# Patient Record
Sex: Female | Born: 1960 | Race: White | Hispanic: No | Smoking: Former smoker
Health system: Southern US, Community
[De-identification: ages and names within clinical notes are randomized; demographics above are authoritative.]

## PROBLEM LIST (undated history)

## (undated) DIAGNOSIS — M1990 Unspecified osteoarthritis, unspecified site: Secondary | ICD-10-CM

## (undated) DIAGNOSIS — K52839 Microscopic colitis, unspecified: Secondary | ICD-10-CM

## (undated) DIAGNOSIS — K219 Gastro-esophageal reflux disease without esophagitis: Secondary | ICD-10-CM

## (undated) DIAGNOSIS — E890 Postprocedural hypothyroidism: Secondary | ICD-10-CM

## (undated) DIAGNOSIS — M109 Gout, unspecified: Secondary | ICD-10-CM

## (undated) DIAGNOSIS — K279 Peptic ulcer, site unspecified, unspecified as acute or chronic, without hemorrhage or perforation: Secondary | ICD-10-CM

## (undated) DIAGNOSIS — J45909 Unspecified asthma, uncomplicated: Secondary | ICD-10-CM

## (undated) HISTORY — PX: THYROIDECTOMY: SHX17

## (undated) HISTORY — PX: VESICO-VAGINAL FISTULA REPAIR: SHX5129

## (undated) HISTORY — PX: KNEE ARTHROSCOPY: SUR90

## (undated) HISTORY — PX: ABDOMINAL HYSTERECTOMY: SHX81

## (undated) HISTORY — PX: BLADDER REPAIR: SHX76

---

## 2016-07-24 ENCOUNTER — Emergency Department (HOSPITAL_COMMUNITY)
Admission: EM | Admit: 2016-07-24 | Discharge: 2016-07-24 | Disposition: A | Discharge status: Home / Self Care | Payer: Self-pay | Attending: Emergency Medicine | Admitting: Emergency Medicine

## 2016-07-24 ENCOUNTER — Encounter (HOSPITAL_COMMUNITY): Payer: Self-pay | Admitting: Emergency Medicine

## 2016-07-24 ENCOUNTER — Emergency Department (HOSPITAL_COMMUNITY): Payer: Self-pay

## 2016-07-24 DIAGNOSIS — Z79899 Other long term (current) drug therapy: Secondary | ICD-10-CM | POA: Insufficient documentation

## 2016-07-24 DIAGNOSIS — M545 Low back pain, unspecified: Secondary | ICD-10-CM

## 2016-07-24 DIAGNOSIS — F17210 Nicotine dependence, cigarettes, uncomplicated: Secondary | ICD-10-CM | POA: Insufficient documentation

## 2016-07-24 HISTORY — DX: Peptic ulcer, site unspecified, unspecified as acute or chronic, without hemorrhage or perforation: K27.9

## 2016-07-24 HISTORY — DX: Microscopic colitis, unspecified: K52.839

## 2016-07-24 HISTORY — DX: Gastro-esophageal reflux disease without esophagitis: K21.9

## 2016-07-24 HISTORY — DX: Unspecified osteoarthritis, unspecified site: M19.90

## 2016-07-24 MED ORDER — METHOCARBAMOL 500 MG PO TABS: 500.0000 mg | ORAL_TABLET | Freq: Two times a day (BID) | ORAL | 0 refills | Status: AC

## 2016-07-24 MED ORDER — TRAMADOL HCL 50 MG PO TABS: 50.0000 mg | ORAL_TABLET | Freq: Four times a day (QID) | ORAL | 0 refills | Status: DC | PRN

## 2016-07-24 NOTE — ED Triage Notes (Signed)
Patient complaining of lower back pain x 5 days. States "the same thing happened about a month ago and lasted about 4-5 days then went away." Patient denies injury. Patient denies urinary problems.

## 2016-07-24 NOTE — Discharge Instructions (Signed)
See your Physician for recheck.  Return if any problems.  °

## 2016-07-24 NOTE — ED Notes (Signed)
Pt reports she began having lower back pain on Saturday, denies injury. 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

## 2016-07-25 NOTE — ED Provider Notes (Signed)
[facility]EMERGENCY DEPT Provider Note   CSN: [id number] Arrival date & time: 07/24/16  1819     History   Chief Complaint Chief Complaint  Patient presents with  . Back Pain    HPI Krystal Brady is a 55 y.o. female.  The history is provided by the patient. No language interpreter was used.  Back Pain   This is a new problem. The current episode started more than 1 week ago. The problem occurs constantly. The problem has been gradually worsening. The pain is associated with no known injury. The pain is present in the thoracic spine. The pain does not radiate. The pain is moderate. Pertinent negatives include no chest pain and no abdominal pain. She has tried nothing for the symptoms. Risk factors include a history of osteoporosis.  Pt complains of pain in her low back.  Pt reports she had the same thing happen a month ago.    Past Medical History:  Diagnosis Date  . Acid reflux   . Arthritis   . Microscopic colitis   . Peptic ulcer     There are no active problems to display for this patient.   Past Surgical History:  Procedure Laterality Date  . ABDOMINAL HYSTERECTOMY    . BLADDER REPAIR    . KNEE ARTHROSCOPY    . THYROIDECTOMY    . VESICO-VAGINAL FISTULA REPAIR      OB History    No data available       Home Medications    Prior to Admission medications   Medication Sig Start Date End Date Taking? Authorizing Provider  Chlorphen-PE-Acetaminophen (COLD MULTI-SYMPTOM PO) Take 1 tablet by mouth once as needed (for cold symptoms).   Yes Historical Provider, MD  fexofenadine (ALLEGRA) 180 MG tablet Take 180 mg by mouth daily.   Yes Historical Provider, MD  fluticasone (FLONASE) 50 MCG/ACT nasal spray Place 1 spray into both nostrils daily.   Yes Historical Provider, MD  Misc Natural Products (OSTEO BI-FLEX TRIPLE STRENGTH) TABS Take 1 tablet by mouth daily.   Yes Historical Provider, MD  Omega-3 Fatty Acids (FISH OIL) 1200 MG CAPS Take 1 capsule by mouth daily.   Yes  Historical Provider, MD  pantoprazole (PROTONIX) 40 MG tablet Take 40 mg by mouth daily.   Yes Historical Provider, MD  Specialty Vitamins Products (MENOPAUSE SUPPORT) TABS Take 1 tablet by mouth daily.   Yes Historical Provider, MD  vitamin C (ASCORBIC ACID) 500 MG tablet Take 500 mg by mouth daily.   Yes Historical Provider, MD  methocarbamol (ROBAXIN) 500 MG tablet Take 1 tablet (500 mg total) by mouth 2 (two) times daily. 07/24/16   Elson Areas, PA-C  traMADol (ULTRAM) 50 MG tablet Take 1 tablet (50 mg total) by mouth every 6 (six) hours as needed. 07/24/16   Elson Areas, PA-C    Family History No family history on file.  Social History Social History  Substance Use Topics  . Smoking status: Current Every Day Smoker    Packs/day: 1.00    Types: Cigarettes  . Smokeless tobacco: Never Used  . Alcohol use Yes     Comment: occasionally     Allergies   Lortab [hydrocodone-acetaminophen]; Nsaids; and Percocet [oxycodone-acetaminophen]   Review of Systems Review of Systems  Cardiovascular: Negative for chest pain.  Gastrointestinal: Negative for abdominal pain.  Musculoskeletal: Positive for back pain.  All other systems reviewed and are negative.    Physical Exam Updated Vital Signs BP 154/68   Pulse  78   Temp 97.8 F (36.6 C) (Oral)   Resp 18   Ht 5\' 5"  (1.651 m)   Wt 90.7 kg   SpO2 98%   BMI 33.28 kg/m   Physical Exam  Constitutional: She appears well-developed and well-nourished. No distress.  HENT:  Head: Normocephalic and atraumatic.  Right Ear: External ear normal.  Left Ear: External ear normal.  Mouth/Throat: Oropharynx is clear and moist.  Eyes: Conjunctivae are normal.  Neck: Neck supple.  Cardiovascular: Normal rate and regular rhythm.   No murmur heard. Pulmonary/Chest: Effort normal and breath sounds normal. No respiratory distress.  Abdominal: Soft. There is no tenderness.  Musculoskeletal: She exhibits no edema.  Neurological: She is  alert. She has normal reflexes.  Skin: Skin is warm and dry.  Psychiatric: She has a normal mood and affect.  Nursing note and vitals reviewed.    ED Treatments / Results  Labs (all labs ordered are listed, but only abnormal results are displayed) Labs Reviewed - No data to display  EKG  EKG Interpretation None       Radiology Dg Lumbar Spine Complete  Result Date: 07/24/2016 CLINICAL DATA:  Low back stiffness, cramping EXAM: LUMBAR SPINE - COMPLETE 4+ VIEW COMPARISON:  None. FINDINGS: There are 5 nonrib bearing lumbar-type vertebral bodies. The vertebral body heights are maintained. The alignment is anatomic. There is no static listhesis. There is no spondylolysis. There is no acute fracture. There is degenerative disc disease with disc height loss at L4-5 and L5-S1. The SI joints are unremarkable. There is abdominal aortic atherosclerosis. IMPRESSION: No acute osseous injury of the lumbar spine. Electronically Signed   By: Elige KoHetal  Patel   On: 07/24/2016 20:03    Procedures Procedures (including critical care time)  Medications Ordered in ED Medications - No data to display   Initial Impression / Assessment and Plan / ED Course  I have reviewed the triage vital signs and the nursing notes.  Pertinent labs & imaging results that were available during my care of the patient were reviewed by me and considered in my medical decision making (see chart for details).  Clinical Course      Final Clinical Impressions(s) / ED Diagnoses   Final diagnoses:  Acute bilateral low back pain without sciatica    New Prescriptions Discharge Medication List as of 07/24/2016  8:20 PM    START taking these medications   Details  methocarbamol (ROBAXIN) 500 MG tablet Take 1 tablet (500 mg total) by mouth 2 (two) times daily., Starting Wed 07/24/2016, Print    traMADol (ULTRAM) 50 MG tablet Take 1 tablet (50 mg total) by mouth every 6 (six) hours as needed., Starting Wed 07/24/2016,  Print      An After Visit Summary was printed and given to the patient.   Elson AreasLeslie K Mirissa Lopresti, PA-C 07/25/16 0049    Elson AreasLeslie K Velita Quirk, PA-C 07/25/16 [id number]    Mancel BaleElliott Wentz, MD 07/30/16 [phone]

## 2019-05-11 ENCOUNTER — Encounter (HOSPITAL_COMMUNITY): Payer: Self-pay | Admitting: Emergency Medicine

## 2019-05-11 ENCOUNTER — Other Ambulatory Visit: Payer: Self-pay

## 2019-05-11 ENCOUNTER — Emergency Department (HOSPITAL_COMMUNITY)
Admission: EM | Admit: 2019-05-11 | Discharge: 2019-05-11 | Disposition: A | Discharge status: Home / Self Care | Payer: Medicaid - Out of State | Attending: Emergency Medicine | Admitting: Emergency Medicine

## 2019-05-11 DIAGNOSIS — Z79899 Other long term (current) drug therapy: Secondary | ICD-10-CM | POA: Diagnosis not present

## 2019-05-11 DIAGNOSIS — R0602 Shortness of breath: Secondary | ICD-10-CM | POA: Diagnosis present

## 2019-05-11 DIAGNOSIS — Z87891 Personal history of nicotine dependence: Secondary | ICD-10-CM | POA: Diagnosis not present

## 2019-05-11 DIAGNOSIS — Z20828 Contact with and (suspected) exposure to other viral communicable diseases: Secondary | ICD-10-CM | POA: Diagnosis not present

## 2019-05-11 DIAGNOSIS — J45901 Unspecified asthma with (acute) exacerbation: Secondary | ICD-10-CM | POA: Diagnosis not present

## 2019-05-11 LAB — SARS CORONAVIRUS 2 BY RT PCR (HOSPITAL ORDER, PERFORMED IN ~~LOC~~ HOSPITAL LAB): SARS Coronavirus 2: NEGATIVE

## 2019-05-11 MED ORDER — PREDNISONE 50 MG PO TABS
60.0000 mg | ORAL_TABLET | Freq: Once | ORAL | Status: AC
  Administered 2019-05-11: 60 mg via ORAL
  Filled 2019-05-11: qty 1

## 2019-05-11 MED ORDER — ALBUTEROL (5 MG/ML) CONTINUOUS INHALATION SOLN
10.0000 mg/h | INHALATION_SOLUTION | Freq: Once | RESPIRATORY_TRACT | Status: AC
  Administered 2019-05-11: 10 mg/h via RESPIRATORY_TRACT
  Filled 2019-05-11: qty 20

## 2019-05-11 MED ORDER — ALBUTEROL (5 MG/ML) CONTINUOUS INHALATION SOLN
10.0000 mg/h | INHALATION_SOLUTION | Freq: Once | RESPIRATORY_TRACT | Status: AC
  Administered 2019-05-11: 10 mg/h via RESPIRATORY_TRACT

## 2019-05-11 MED ORDER — ALBUTEROL SULFATE HFA 108 (90 BASE) MCG/ACT IN AERS
8.0000 | INHALATION_SPRAY | Freq: Once | RESPIRATORY_TRACT | Status: AC
  Administered 2019-05-11: 8 via RESPIRATORY_TRACT
  Filled 2019-05-11: qty 6.7

## 2019-05-11 MED ORDER — PREDNISONE 20 MG PO TABS: ORAL_TABLET | ORAL | 0 refills | Status: DC

## 2019-05-11 NOTE — ED Triage Notes (Signed)
Pt C/O SOB that has been going on since she stopped her steroid 3 days ago. Pt states today it has gotten worse. Pt has had duoneb inhaler and albuterol inhaler with no relief.

## 2019-05-11 NOTE — Discharge Instructions (Addendum)
Use your inhalers as prescribed.  Take the prednisone as prescribed.  Please call your doctors at [facility] to discuss if you need to be on a steroid inhaler.  Recheck if you get fever, cough, cough up mucus, or your breathing gets worse.

## 2019-05-11 NOTE — ED Provider Notes (Signed)
[facility] EMERGENCY DEPARTMENT Provider Note   CSN: [id number] Arrival date & time: 05/11/19  0101  Time seen 1:30 AM  History   Chief Complaint Chief Complaint  Patient presents with  . Shortness of Breath    HPI Krystal Brady is a 58 y.o. female.     HPI patient states she first started having breathing troubles about 3 years ago in July when she was admitted for wheezing and was diagnosed with asthma.  She states she had been on prednisone recently and she started noticing as she did the taper and got down to 1 pill a day she started getting more wheezing.  She finished the prednisone 3 days ago and yesterday, August 9 she started having a lot of wheezing.  She states it gets a little bit better after she uses her inhalers.  She states she coughs to clear her throat but she is not really coughing.  She denies fever, rhinorrhea, nausea, or vomiting.  She states [facility] and they are referring her to a pulmonologist to get further testing done.  She states she was a former smoker and she quit about 8 months ago.  She states she has noticed an improvement since she quit smoking.  She shows me a CT report of her chest that was done in June that just showed 2 pulmonary nodules.  She also has had an exercise stress test done.  She denies any family history of breathing problems and states her history in her family is mainly for cancer.  PCP Patient, No Pcp Per   Past Medical History:  Diagnosis Date  . Acid reflux   . Arthritis   . Microscopic colitis   . Peptic ulcer     There are no active problems to display for this patient.   Past Surgical History:  Procedure Laterality Date  . ABDOMINAL HYSTERECTOMY    . BLADDER REPAIR    . KNEE ARTHROSCOPY    . THYROIDECTOMY    . VESICO-VAGINAL FISTULA REPAIR       OB History   No obstetric history on file.      Home Medications    Prior to Admission medications   Medication Sig Start  Date End Date Taking? Authorizing Provider  Chlorphen-PE-Acetaminophen (COLD MULTI-SYMPTOM PO) Take 1 tablet by mouth once as needed (for cold symptoms).    [provider]  fexofenadine (ALLEGRA) 180 MG tablet Take 180 mg by mouth daily.    [provider]  fluticasone (FLONASE) 50 MCG/ACT nasal spray Place 1 spray into both nostrils daily.    [provider]  methocarbamol (ROBAXIN) 500 MG tablet Take 1 tablet (500 mg total) by mouth 2 (two) times daily. 07/24/16   Elson AreasSofia, Leslie K, PA-C  Misc Natural Products (OSTEO BI-FLEX TRIPLE STRENGTH) TABS Take 1 tablet by mouth daily.    [provider]  Omega-3 Fatty Acids (FISH OIL) 1200 MG CAPS Take 1 capsule by mouth daily.    [provider]  pantoprazole (PROTONIX) 40 MG tablet Take 40 mg by mouth daily.    [provider]  predniSONE (DELTASONE) 20 MG tablet Take 3 po QD x 3d , then 2 po QD x 3d then 1 po QD x 3d 05/11/19   Devoria AlbeKnapp, Pookela Sellin, MD  Specialty Vitamins Products (MENOPAUSE SUPPORT) TABS Take 1 tablet by mouth daily.    [provider]  traMADol (ULTRAM) 50 MG tablet Take 1 tablet (50 mg total)  by mouth every 6 (six) hours as needed. 07/24/16   Fransico Meadow, PA-C  vitamin C (ASCORBIC ACID) 500 MG tablet Take 500 mg by mouth daily.    [provider]    Family History No family history on file.  Social History Social History   Tobacco Use  . Smoking status: Former Smoker    Packs/day: 1.00    Types: Cigarettes  . Smokeless tobacco: Never Used  Substance Use Topics  . Alcohol use: Not Currently    Comment: occasionally  . Drug use: No     Allergies   Lortab [hydrocodone-acetaminophen], Nsaids, and Percocet [oxycodone-acetaminophen]   Review of Systems Review of Systems  All other systems reviewed and are negative.    Physical Exam Updated Vital Signs BP (!) 111/58   Pulse 92   Temp 97.7 F (36.5 C) (Oral)   Resp 19   Ht 5\' 6"  (1.676 m)   Wt  99.3 kg   SpO2 100%   BMI 35.35 kg/m   Physical Exam Vitals signs and nursing note reviewed.  Constitutional:      Appearance: She is well-developed.  HENT:     Head: Normocephalic and atraumatic.     Right Ear: External ear normal.     Left Ear: External ear normal.     Nose: Nose normal.     Mouth/Throat:     Mouth: Mucous membranes are moist.  Eyes:     Extraocular Movements: Extraocular movements intact.     Conjunctiva/sclera: Conjunctivae normal.     Pupils: Pupils are equal, round, and reactive to light.  Neck:     Musculoskeletal: Normal range of motion.  Cardiovascular:     Rate and Rhythm: Normal rate and regular rhythm.     Pulses: Normal pulses.  Pulmonary:     Effort: Tachypnea, accessory muscle usage and prolonged expiration present.     Breath sounds: Wheezing present.     Comments: Patient has audible wheezing Musculoskeletal: Normal range of motion.  Skin:    General: Skin is warm and dry.     Capillary Refill: Capillary refill takes less than 2 seconds.  Neurological:     General: No focal deficit present.     Mental Status: She is alert and oriented to person, place, and time.     Cranial Nerves: No cranial nerve deficit.  Psychiatric:        Mood and Affect: Mood normal.        Behavior: Behavior normal.        Thought Content: Thought content normal.      ED Treatments / Results  Labs (all labs ordered are listed, but only abnormal results are displayed) Results for orders placed or performed during the hospital encounter of 05/11/19  SARS Coronavirus 2 [facility] order, Performed in [facility] hospital lab) Nasopharyngeal Nasopharyngeal Swab   Specimen: Nasopharyngeal Swab  Result Value Ref Range   SARS Coronavirus 2 NEGATIVE NEGATIVE   Laboratory interpretation all normal    EKG None  Radiology No results found.  Procedures Procedures (including critical care time)  Medications Ordered in ED Medications  predniSONE (DELTASONE)  tablet 60 mg (60 mg Oral Given 05/11/19 0143)  albuterol (VENTOLIN HFA) 108 (90 Base) MCG/ACT inhaler 8 puff (8 puffs Inhalation Given 05/11/19 0143)  albuterol (PROVENTIL,VENTOLIN) solution continuous neb (10 mg/hr Nebulization Given 05/11/19 0331)  albuterol (PROVENTIL,VENTOLIN) solution continuous neb (10 mg/hr Nebulization Given 05/11/19 0516)     Initial Impression / Assessment and Plan /  ED Course  I have reviewed the triage vital signs and the nursing notes.  Pertinent labs & imaging results that were available during my care of the patient were reviewed by me and considered in my medical decision making (see chart for details).  COVID testing was done.  I explained to the patient we cannot give her a nebulizer until she has a negative COVID test.  She was started on oral prednisone and given albuterol 8 puffs.  She states she is never been on a steroid inhaler before, it sounds like that is what she needs to be on.  Recheck at 3:15 AM patient states she is feeling better however she still wheezing.  I no longer hear audible wheezing but when I listen to her lungs she does have diffuse inspiratory and expiratory wheezing.  Her COVID test has come back negative and a continuous nebulizer was ordered.  Recheck at 4:35 AM patient just finished her continuous nebulizer.  She states she is feeling better but she still feels like she is having some wheezing.  She is able to speak in sentences now and does not appear to be tachypneic.  When I listen to her she has diffuse rhonchi in all lung fields.  I am going to let her have a little break and then we will do another continuous nebulizer.  Recheck at 625 at the end of her second continuous nebulizer.  Patient is sleeping peacefully.  When awakened she states her breathing is better.  Her lungs are now clear.  She was ambulated by nursing staff and her pulse ox remained 98%.  She still has some tachycardia however she is gotten a lot of albuterol.   She states her breathing is much improved.  We discussed talking to her doctors about putting her on a steroid inhaler.  Final Clinical Impressions(s) / ED Diagnoses   Final diagnoses:  Moderate asthma with exacerbation, unspecified whether persistent    ED Discharge Orders         Ordered    predniSONE (DELTASONE) 20 MG tablet     05/11/19 [id number]         Plan discharge  Devoria AlbeIva Baylee Mccorkel, MD, Concha PyoFACEP    Jaisean Monteforte, MD 05/11/19 [phone]

## 2019-05-11 NOTE — ED Notes (Signed)
Pt ambulated on room air and o2 stayed at 98%. She ambulated without assistance.

## 2021-01-09 ENCOUNTER — Encounter (HOSPITAL_COMMUNITY): Payer: Self-pay | Admitting: *Deleted

## 2021-01-09 ENCOUNTER — Other Ambulatory Visit: Payer: Self-pay

## 2021-01-09 ENCOUNTER — Emergency Department (HOSPITAL_COMMUNITY)
Admission: EM | Admit: 2021-01-09 | Discharge: 2021-01-09 | Disposition: A | Discharge status: Home / Self Care | Payer: Medicaid Other | Attending: Emergency Medicine | Admitting: Emergency Medicine

## 2021-01-09 DIAGNOSIS — M1000 Idiopathic gout, unspecified site: Secondary | ICD-10-CM | POA: Diagnosis not present

## 2021-01-09 DIAGNOSIS — M25531 Pain in right wrist: Secondary | ICD-10-CM | POA: Diagnosis not present

## 2021-01-09 DIAGNOSIS — Z7982 Long term (current) use of aspirin: Secondary | ICD-10-CM | POA: Insufficient documentation

## 2021-01-09 DIAGNOSIS — Z7952 Long term (current) use of systemic steroids: Secondary | ICD-10-CM | POA: Insufficient documentation

## 2021-01-09 DIAGNOSIS — Z87891 Personal history of nicotine dependence: Secondary | ICD-10-CM | POA: Diagnosis not present

## 2021-01-09 DIAGNOSIS — J45909 Unspecified asthma, uncomplicated: Secondary | ICD-10-CM | POA: Diagnosis not present

## 2021-01-09 DIAGNOSIS — M79671 Pain in right foot: Secondary | ICD-10-CM | POA: Diagnosis present

## 2021-01-09 HISTORY — DX: Gout, unspecified: M10.9

## 2021-01-09 HISTORY — DX: Unspecified asthma, uncomplicated: J45.909

## 2021-01-09 HISTORY — DX: Postprocedural hypothyroidism: E89.0

## 2021-01-09 LAB — COMPREHENSIVE METABOLIC PANEL
ALT: 16 U/L (ref 0–44)
AST: 18 U/L (ref 15–41)
Albumin: 4 g/dL (ref 3.5–5.0)
Alkaline Phosphatase: 64 U/L (ref 38–126)
Anion gap: 12 (ref 5–15)
BUN: 11 mg/dL (ref 8–23)
CO2: 27 mmol/L (ref 22–32)
Calcium: 8.9 mg/dL (ref 8.9–10.3)
Chloride: 101 mmol/L (ref 98–111)
Creatinine, Ser: 0.56 mg/dL (ref 0.44–1.00)
GFR, Estimated: >60 mL/min (ref >60)
Glucose, Bld: 98 mg/dL (ref 70–99)
Potassium: 4 mmol/L (ref 3.5–5.1)
Sodium: 140 mmol/L (ref 135–145)
Total Bilirubin: 0.9 mg/dL (ref 0.3–1.2)
Total Protein: 7.4 g/dL (ref 6.5–8.1)

## 2021-01-09 LAB — CBC WITH DIFFERENTIAL/PLATELET
Abs Immature Granulocytes: 0.02 10*3/uL (ref 0.00–0.07)
Basophils Absolute: 0.1 10*3/uL (ref 0.0–0.1)
Basophils Relative: 1 %
Eosinophils Absolute: 0.1 10*3/uL (ref 0.0–0.5)
Eosinophils Relative: 1 %
HCT: 39.7 % (ref 36.0–46.0)
Hemoglobin: 12.3 g/dL (ref 12.0–15.0)
Immature Granulocytes: 0 %
Lymphocytes Relative: 15 %
Lymphs Abs: 1 10*3/uL (ref 0.7–4.0)
MCH: 26.9 pg (ref 26.0–34.0)
MCHC: 31 g/dL (ref 30.0–36.0)
MCV: 86.9 fL (ref 80.0–100.0)
Monocytes Absolute: 0.6 10*3/uL (ref 0.1–1.0)
Monocytes Relative: 9 %
Neutro Abs: 5.1 10*3/uL (ref 1.7–7.7)
Neutrophils Relative %: 74 %
Platelets: 442 10*3/uL — ABNORMAL HIGH (ref 150–400)
RBC: 4.57 MIL/uL (ref 3.87–5.11)
RDW: 14.9 % (ref 11.5–15.5)
WBC: 6.9 10*3/uL (ref 4.0–10.5)
nRBC: 0 % (ref 0.0–0.2)

## 2021-01-09 LAB — URIC ACID: Uric Acid, Serum: 5.5 mg/dL (ref 2.5–7.1)

## 2021-01-09 MED ORDER — TRAMADOL HCL 50 MG PO TABS: 50.0000 mg | ORAL_TABLET | Freq: Four times a day (QID) | ORAL | 0 refills | Status: DC | PRN

## 2021-01-09 MED ORDER — TRAMADOL HCL 50 MG PO TABS
50.0000 mg | ORAL_TABLET | Freq: Once | ORAL | Status: AC
Start: 2021-01-09 — End: 2021-01-09
  Administered 2021-01-09: 50 mg via ORAL
  Filled 2021-01-09: qty 1

## 2021-01-09 MED ORDER — PREDNISONE 10 MG PO TABS: 20.0000 mg | ORAL_TABLET | Freq: Every day | ORAL | 0 refills | Status: AC

## 2021-01-09 NOTE — ED Notes (Signed)
Pt noted with bilateral foot and ankle edema, non-pitting. Both ankles tender to touch, as well as bottom of feet. She also reports complaints of similar pain in right wrist/hand, however there is no edema noted to wrist or hand. She currently is using her father's old cane to walk. States she noticed the swelling after increasing her protein intake via supplements. She reports no known injury related to pain/swelling. Call light in reach, bed locked in lowest position.

## 2021-01-09 NOTE — ED Triage Notes (Signed)
Pt c/o right foot and right wrist pain that has been coming on for a few weeks that worsened tremendously this morning when she woke up. Pt reports hx of gout and says this feels very similar.

## 2021-01-09 NOTE — ED Provider Notes (Signed)
[facility] EMERGENCY DEPARTMENT Provider Note   CSN: [id number] Arrival date & time: 01/09/21  7412     History Chief Complaint  Patient presents with  . Foot Pain  . Wrist Pain    Krystal Brady is a 60 y.o. female.  Patient complains of pain in her right wrist and right foot.  She has had gout in both of these before  The history is provided by the patient and medical records. No language interpreter was used.  Foot Pain This is a recurrent problem. The current episode started 2 days ago. The problem occurs constantly. The problem has not changed since onset.Pertinent negatives include no chest pain, no abdominal pain and no headaches. The symptoms are aggravated by walking. Nothing relieves the symptoms. She has tried nothing for the symptoms.       Past Medical History:  Diagnosis Date  . Acid reflux   . Arthritis   . Asthma   . Gout   . History of partial thyroidectomy   . Microscopic colitis   . Peptic ulcer     There are no problems to display for this patient.   Past Surgical History:  Procedure Laterality Date  . ABDOMINAL HYSTERECTOMY    . BLADDER REPAIR    . KNEE ARTHROSCOPY    . THYROIDECTOMY    . VESICO-VAGINAL FISTULA REPAIR       OB History   No obstetric history on file.     No family history on file.  Social History   Tobacco Use  . Smoking status: Former Smoker    Packs/day: 1.00    Types: Cigarettes  . Smokeless tobacco: Never Used  Vaping Use  . Vaping Use: Never used  Substance Use Topics  . Alcohol use: Yes    Comment: special occasion  . Drug use: No    Home Medications Prior to Admission medications   Medication Sig Start Date End Date Taking? Authorizing Provider  ADVAIR DISKUS 250-50 MCG/DOSE AEPB Inhale 1 puff into the lungs 2 (two) times daily. 12/25/20  Yes [provider]  aspirin 81 MG EC tablet Take 1 tablet by mouth daily. 11/10/20  Yes [provider]  cholecalciferol (VITAMIN D3) 25 MCG  (1000 UNIT) tablet Take 1,000 Units by mouth daily.   Yes [provider]  fexofenadine (ALLEGRA) 180 MG tablet Take 180 mg by mouth daily.   Yes [provider]  fluticasone (FLONASE) 50 MCG/ACT nasal spray Place 1 spray into both nostrils daily as needed for allergies.   Yes [provider]  Garlic 1000 MG CAPS Take 1 capsule by mouth daily.   Yes [provider]  Misc Natural Products (OSTEO BI-FLEX TRIPLE STRENGTH) TABS Take 1 tablet by mouth daily.   Yes [provider]  nicotine polacrilex (COMMIT) 2 MG lozenge Take 2 mg by mouth as needed for smoking cessation.   Yes [provider]  Omega-3 Fatty Acids (FISH OIL) 1200 MG CAPS Take 1 capsule by mouth daily.   Yes [provider]  pantoprazole (PROTONIX) 40 MG tablet Take 40 mg by mouth daily.   Yes [provider]  predniSONE (DELTASONE) 10 MG tablet Take 2 tablets (20 mg total) by mouth daily. 01/09/21  Yes Bethann Berkshire, MD  traMADol (ULTRAM) 50 MG tablet Take 1 tablet (50 mg total) by mouth every 6 (six) hours as needed. 01/09/21  Yes Bethann Berkshire, MD  VENTOLIN HFA 108 (90 Base) MCG/ACT inhaler Inhale 1-2 puffs into the lungs  every 6 (six) hours as needed for wheezing or shortness of breath. 12/12/20  Yes [provider]  vitamin C (ASCORBIC ACID) 500 MG tablet Take 500 mg by mouth daily.   Yes [provider]  methocarbamol (ROBAXIN) 500 MG tablet Take 1 tablet (500 mg total) by mouth 2 (two) times daily. Patient not taking: No sig reported 07/24/16   Elson Areas, PA-C    Allergies    Lortab [hydrocodone-acetaminophen], Nsaids, and Percocet [oxycodone-acetaminophen]  Review of Systems   Review of Systems  Constitutional: Negative for appetite change and fatigue.  HENT: Negative for congestion, ear discharge and sinus pressure.   Eyes: Negative for discharge.  Respiratory: Negative for cough.   Cardiovascular: Negative for chest pain.   Gastrointestinal: Negative for abdominal pain and diarrhea.  Genitourinary: Negative for frequency and hematuria.  Musculoskeletal: Negative for back pain.       Pain in right wrist and ankle  Skin: Negative for rash.  Neurological: Negative for seizures and headaches.  Psychiatric/Behavioral: Negative for hallucinations.    Physical Exam Updated Vital Signs BP (!) 137/57   Pulse 76   Temp 97.8 F (36.6 C) (Oral)   Resp 20   Ht 5\' 5"  (1.651 m)   Wt 95.3 kg   SpO2 98%   BMI 34.95 kg/m   Physical Exam Vitals and nursing note reviewed.  Constitutional:      Appearance: She is well-developed.  HENT:     Head: Normocephalic.     Nose: Nose normal.  Eyes:     General: No scleral icterus.    Conjunctiva/sclera: Conjunctivae normal.  Neck:     Thyroid: No thyromegaly.  Cardiovascular:     Rate and Rhythm: Normal rate and regular rhythm.     Heart sounds: No murmur heard. No friction rub. No gallop.   Pulmonary:     Breath sounds: No stridor. No wheezing or rales.  Chest:     Chest wall: No tenderness.  Abdominal:     General: There is no distension.     Tenderness: There is no abdominal tenderness. There is no rebound.  Musculoskeletal:     Cervical back: Neck supple.     Comments: Tenderness right wrist and ankle  Lymphadenopathy:     Cervical: No cervical adenopathy.  Skin:    Findings: No erythema or rash.  Neurological:     Mental Status: She is alert and oriented to person, place, and time.     Motor: No abnormal muscle tone.     Coordination: Coordination normal.  Psychiatric:        Behavior: Behavior normal.     ED Results / Procedures / Treatments   Labs (all labs ordered are listed, but only abnormal results are displayed) Labs Reviewed  CBC WITH DIFFERENTIAL/PLATELET - Abnormal; Notable for the following components:      Result Value   Platelets 442 (*)    All other components within normal limits  COMPREHENSIVE METABOLIC PANEL  URIC ACID     EKG None  Radiology No results found.  Procedures Procedures   Medications Ordered in ED Medications  traMADol (ULTRAM) tablet 50 mg (50 mg Oral Given 01/09/21 0946)    ED Course  I have reviewed the triage vital signs and the nursing notes.  Pertinent labs & imaging results that were available during my care of the patient were reviewed by me and considered in my medical decision making (see chart for details).    MDM Rules/Calculators/A&P  Patient with right wrist and right ankle pain most like related to gout.  She is put on prednisone and Ultram and will follow up with PCP Final Clinical Impression(s) / ED Diagnoses Final diagnoses:  Idiopathic gout, unspecified chronicity, unspecified site    Rx / DC Orders ED Discharge Orders         Ordered    predniSONE (DELTASONE) 10 MG tablet  Daily        01/09/21 1119    traMADol (ULTRAM) 50 MG tablet  Every 6 hours PRN        01/09/21 1119           Bethann Berkshire, MD 01/10/21 1748

## 2021-01-09 NOTE — Discharge Instructions (Addendum)
Follow-up with your family doctor next week for recheck. 

## 2021-04-16 ENCOUNTER — Encounter (HOSPITAL_COMMUNITY): Payer: Self-pay | Admitting: Emergency Medicine

## 2021-04-16 ENCOUNTER — Emergency Department (HOSPITAL_COMMUNITY)
Admission: EM | Admit: 2021-04-16 | Discharge: 2021-04-16 | Disposition: A | Discharge status: Home / Self Care | Payer: Medicaid Other | Attending: Emergency Medicine | Admitting: Emergency Medicine

## 2021-04-16 ENCOUNTER — Emergency Department (HOSPITAL_COMMUNITY): Payer: Medicaid Other

## 2021-04-16 ENCOUNTER — Other Ambulatory Visit: Payer: Self-pay

## 2021-04-16 DIAGNOSIS — Z87891 Personal history of nicotine dependence: Secondary | ICD-10-CM | POA: Diagnosis not present

## 2021-04-16 DIAGNOSIS — R112 Nausea with vomiting, unspecified: Secondary | ICD-10-CM | POA: Insufficient documentation

## 2021-04-16 DIAGNOSIS — Z7982 Long term (current) use of aspirin: Secondary | ICD-10-CM | POA: Diagnosis not present

## 2021-04-16 DIAGNOSIS — J45909 Unspecified asthma, uncomplicated: Secondary | ICD-10-CM | POA: Diagnosis not present

## 2021-04-16 DIAGNOSIS — K219 Gastro-esophageal reflux disease without esophagitis: Secondary | ICD-10-CM | POA: Diagnosis not present

## 2021-04-16 DIAGNOSIS — R197 Diarrhea, unspecified: Secondary | ICD-10-CM | POA: Insufficient documentation

## 2021-04-16 DIAGNOSIS — R1012 Left upper quadrant pain: Secondary | ICD-10-CM | POA: Insufficient documentation

## 2021-04-16 LAB — CBC WITH DIFFERENTIAL/PLATELET
Abs Immature Granulocytes: 0.02 10*3/uL (ref 0.00–0.07)
Basophils Absolute: 0 10*3/uL (ref 0.0–0.1)
Basophils Relative: 1 %
Eosinophils Absolute: 0.1 10*3/uL (ref 0.0–0.5)
Eosinophils Relative: 2 %
HCT: 40.2 % (ref 36.0–46.0)
Hemoglobin: 12.6 g/dL (ref 12.0–15.0)
Immature Granulocytes: 0 %
Lymphocytes Relative: 26 %
Lymphs Abs: 1.5 10*3/uL (ref 0.7–4.0)
MCH: 27.4 pg (ref 26.0–34.0)
MCHC: 31.3 g/dL (ref 30.0–36.0)
MCV: 87.4 fL (ref 80.0–100.0)
Monocytes Absolute: 0.5 10*3/uL (ref 0.1–1.0)
Monocytes Relative: 9 %
Neutro Abs: 3.7 10*3/uL (ref 1.7–7.7)
Neutrophils Relative %: 62 %
Platelets: 393 10*3/uL (ref 150–400)
RBC: 4.6 MIL/uL (ref 3.87–5.11)
RDW: 15.3 % (ref 11.5–15.5)
WBC: 5.9 10*3/uL (ref 4.0–10.5)
nRBC: 0 % (ref 0.0–0.2)

## 2021-04-16 LAB — URINALYSIS, ROUTINE W REFLEX MICROSCOPIC
Bilirubin Urine: NEGATIVE
Glucose, UA: NEGATIVE mg/dL
Hgb urine dipstick: NEGATIVE
Ketones, ur: NEGATIVE mg/dL
Leukocytes,Ua: NEGATIVE
Nitrite: NEGATIVE
Protein, ur: NEGATIVE mg/dL
Specific Gravity, Urine: 1.019 (ref 1.005–1.030)
pH: 5 (ref 5.0–8.0)

## 2021-04-16 LAB — COMPREHENSIVE METABOLIC PANEL
ALT: 15 U/L (ref 0–44)
AST: 19 U/L (ref 15–41)
Albumin: 3.9 g/dL (ref 3.5–5.0)
Alkaline Phosphatase: 69 U/L (ref 38–126)
Anion gap: 7 (ref 5–15)
BUN: 8 mg/dL (ref 8–23)
CO2: 29 mmol/L (ref 22–32)
Calcium: 8.7 mg/dL — ABNORMAL LOW (ref 8.9–10.3)
Chloride: 98 mmol/L (ref 98–111)
Creatinine, Ser: 0.6 mg/dL (ref 0.44–1.00)
GFR, Estimated: >60 mL/min (ref >60)
Glucose, Bld: 100 mg/dL — ABNORMAL HIGH (ref 70–99)
Potassium: 3.3 mmol/L — ABNORMAL LOW (ref 3.5–5.1)
Sodium: 134 mmol/L — ABNORMAL LOW (ref 135–145)
Total Bilirubin: 0.7 mg/dL (ref 0.3–1.2)
Total Protein: 7.1 g/dL (ref 6.5–8.1)

## 2021-04-16 LAB — LIPASE, BLOOD: Lipase: 29 U/L (ref 11–51)

## 2021-04-16 MED ORDER — PANTOPRAZOLE SODIUM 20 MG PO TBEC: 20.0000 mg | DELAYED_RELEASE_TABLET | Freq: Every day | ORAL | 0 refills | Status: AC

## 2021-04-16 MED ORDER — ONDANSETRON 4 MG PO TBDP: 4.0000 mg | ORAL_TABLET | Freq: Three times a day (TID) | ORAL | 0 refills | Status: AC | PRN

## 2021-04-16 MED ORDER — ONDANSETRON HCL 4 MG/2ML IJ SOLN
4.0000 mg | Freq: Once | INTRAMUSCULAR | Status: AC
  Administered 2021-04-16: 4 mg via INTRAVENOUS
  Filled 2021-04-16: qty 2

## 2021-04-16 MED ORDER — PANTOPRAZOLE SODIUM 40 MG IV SOLR
40.0000 mg | Freq: Once | INTRAVENOUS | Status: AC
  Administered 2021-04-16: 40 mg via INTRAVENOUS
  Filled 2021-04-16: qty 40

## 2021-04-16 MED ORDER — SODIUM CHLORIDE 0.9 % IV BOLUS
1000.0000 mL | Freq: Once | INTRAVENOUS | Status: AC
  Administered 2021-04-16: 1000 mL via INTRAVENOUS

## 2021-04-16 MED ORDER — POTASSIUM CHLORIDE CRYS ER 20 MEQ PO TBCR
40.0000 meq | EXTENDED_RELEASE_TABLET | Freq: Once | ORAL | Status: AC
  Administered 2021-04-16: 40 meq via ORAL
  Filled 2021-04-16: qty 2

## 2021-04-16 MED ORDER — IOHEXOL 300 MG/ML  SOLN
100.0000 mL | Freq: Once | INTRAMUSCULAR | Status: AC | PRN
  Administered 2021-04-16: 100 mL via INTRAVENOUS

## 2021-04-16 NOTE — ED Provider Notes (Signed)
[facility] EMERGENCY DEPARTMENT Provider Note   CSN: [id number] Arrival date & time: 04/16/21  4166     History Chief Complaint  Patient presents with   Abdominal Pain    Krystal Brady is a 60 y.o. female.  60 year old female with history of RA, peptic ulcer disease, colitis presents with complaint of LUQ abdominal pain for the past week, with vomiting and diarrhea onset Saturday. Emesis and stools non bloody, stool described as loose/watery with 3 episodes daily (occurs with eating). Patient is scheduled to see her PCP for same tomorrow however came to the ER as she was awake all night vomiting. Denies fevers, chills, chest pain, shortness of breath. Reports recently on antibiotics for nasal congestion when she tested positive for COVID and flu A, also treated with steroids.       Past Medical History:  Diagnosis Date   Acid reflux    Arthritis    Asthma    Gout    History of partial thyroidectomy    Microscopic colitis    Peptic ulcer     There are no problems to display for this patient.   Past Surgical History:  Procedure Laterality Date   ABDOMINAL HYSTERECTOMY     BLADDER REPAIR     KNEE ARTHROSCOPY     THYROIDECTOMY     VESICO-VAGINAL FISTULA REPAIR       OB History   No obstetric history on file.     History reviewed. No pertinent family history.  Social History   Tobacco Use   Smoking status: Former    Packs/day: 1.00    Types: Cigarettes   Smokeless tobacco: Never  Vaping Use   Vaping Use: Never used  Substance Use Topics   Alcohol use: Yes    Comment: special occasion   Drug use: No    Home Medications Prior to Admission medications   Medication Sig Start Date End Date Taking? Authorizing Provider  ondansetron (ZOFRAN ODT) 4 MG disintegrating tablet Take 1 tablet (4 mg total) by mouth every 8 (eight) hours as needed for nausea or vomiting. 04/16/21  Yes Jeannie Fend, PA-C  pantoprazole (PROTONIX) 20 MG tablet Take 1 tablet (20 mg  total) by mouth daily for 10 days. 04/16/21 04/26/21 Yes Jeannie Fend, PA-C  ADVAIR DISKUS 250-50 MCG/DOSE AEPB Inhale 1 puff into the lungs 2 (two) times daily. 12/25/20   [provider]  aspirin 81 MG EC tablet Take 1 tablet by mouth daily. 11/10/20   [provider]  cholecalciferol (VITAMIN D3) 25 MCG (1000 UNIT) tablet Take 1,000 Units by mouth daily.    [provider]  fexofenadine (ALLEGRA) 180 MG tablet Take 180 mg by mouth daily.    [provider]  fluticasone (FLONASE) 50 MCG/ACT nasal spray Place 1 spray into both nostrils daily as needed for allergies.    [provider]  Garlic 1000 MG CAPS Take 1 capsule by mouth daily.    [provider]  methocarbamol (ROBAXIN) 500 MG tablet Take 1 tablet (500 mg total) by mouth 2 (two) times daily. Patient not taking: No sig reported 07/24/16   Cheron Schaumann K, PA-C  Misc Natural Products (OSTEO BI-FLEX TRIPLE STRENGTH) TABS Take 1 tablet by mouth daily.    [provider]  nicotine polacrilex (COMMIT) 2 MG lozenge Take 2 mg by mouth as needed for smoking cessation.    [provider]  Omega-3 Fatty Acids (FISH OIL) 1200 MG CAPS Take 1 capsule  by mouth daily.    [provider]  pantoprazole (PROTONIX) 40 MG tablet Take 40 mg by mouth daily.    [provider]  predniSONE (DELTASONE) 10 MG tablet Take 2 tablets (20 mg total) by mouth daily. 01/09/21   Bethann Berkshire, MD  VENTOLIN HFA 108 [phone] Base) MCG/ACT inhaler Inhale 1-2 puffs into the lungs every 6 (six) hours as needed for wheezing or shortness of breath. 12/12/20   [provider]  vitamin C (ASCORBIC ACID) 500 MG tablet Take 500 mg by mouth daily.    [provider]    Allergies    Lortab [hydrocodone-acetaminophen], Nsaids, and Percocet [oxycodone-acetaminophen]  Review of Systems   Review of Systems  Constitutional:  Positive for diaphoresis. Negative for chills and fever.   Respiratory:  Negative for shortness of breath.   Cardiovascular:  Negative for chest pain.  Gastrointestinal:  Positive for abdominal pain, diarrhea, nausea and vomiting. Negative for abdominal distention, blood in stool and constipation.  Genitourinary:  Negative for dysuria and frequency.  Musculoskeletal:  Negative for arthralgias and myalgias.  Skin:  Negative for rash and wound.  Allergic/Immunologic: Positive for immunocompromised state.  Neurological:  Negative for weakness.  Hematological:  Negative for adenopathy.  All other systems reviewed and are negative.  Physical Exam Updated Vital Signs BP (!) 112/52   Pulse 71   Temp 98.2 F (36.8 C) (Oral)   Resp 18   Ht 5\' 5"  (1.651 m)   Wt 93 kg   SpO2 93%   BMI 34.11 kg/m   Physical Exam Vitals and nursing note reviewed.  Constitutional:      General: She is not in acute distress.    Appearance: She is well-developed. She is not diaphoretic.  HENT:     Head: Normocephalic and atraumatic.     Mouth/Throat:     Mouth: Mucous membranes are moist.  Eyes:     Conjunctiva/sclera: Conjunctivae normal.  Cardiovascular:     Rate and Rhythm: Normal rate and regular rhythm.     Pulses: Normal pulses.     Heart sounds: Normal heart sounds.  Pulmonary:     Effort: Pulmonary effort is normal.     Breath sounds: Normal breath sounds.  Abdominal:     Palpations: Abdomen is soft.     Tenderness: There is no abdominal tenderness. There is no right CVA tenderness or left CVA tenderness.  Musculoskeletal:     Right lower leg: No edema.     Left lower leg: No edema.  Skin:    General: Skin is warm and dry.     Findings: No erythema or rash.  Neurological:     Mental Status: She is alert and oriented to person, place, and time.  Psychiatric:        Behavior: Behavior normal.    ED Results / Procedures / Treatments   Labs (all labs ordered are listed, but only abnormal results are displayed) Labs Reviewed  COMPREHENSIVE  METABOLIC PANEL - Abnormal; Notable for the following components:      Result Value   Sodium 134 (*)    Potassium 3.3 (*)    Glucose, Bld 100 (*)    Calcium 8.7 (*)    All other components within normal limits  URINALYSIS, ROUTINE W REFLEX MICROSCOPIC - Abnormal; Notable for the following components:   APPearance HAZY (*)    All other components within normal limits  CBC WITH DIFFERENTIAL/PLATELET  LIPASE, BLOOD    EKG None  Radiology CT Abdomen Pelvis W Contrast  Result Date: 04/16/2021 CLINICAL DATA:  Abdominal pain EXAM: CT ABDOMEN AND PELVIS WITH CONTRAST TECHNIQUE: Multidetector CT imaging of the abdomen and pelvis was performed using the standard protocol following bolus administration of intravenous contrast. CONTRAST:  100mL OMNIPAQUE IOHEXOL 300 MG/ML  SOLN COMPARISON:  None. FINDINGS: Lower chest: Small hiatal hernia. Hepatobiliary: No suspicious focal abnormality within the liver parenchyma. There is no evidence for gallstones, gallbladder wall thickening, or pericholecystic fluid. No intrahepatic or extrahepatic biliary dilation. Pancreas: No focal mass lesion. No dilatation of the main duct. No intraparenchymal cyst. No peripancreatic edema. Spleen: No splenomegaly. No focal mass lesion. Adrenals/Urinary Tract: No adrenal nodule or mass. Kidneys unremarkable. No evidence for hydroureter. The urinary bladder appears normal for the degree of distention. Stomach/Bowel: Small hiatal hernia. Stomach otherwise unremarkable. Duodenum is normally positioned as is the ligament of Treitz. No small bowel wall thickening. No small bowel dilatation. The terminal ileum is normal. The appendix is normal. No gross colonic mass. No colonic wall thickening. Vascular/Lymphatic: There is moderate atherosclerotic calcification of the abdominal aorta without aneurysm. There is no gastrohepatic or hepatoduodenal ligament lymphadenopathy. No retroperitoneal or mesenteric lymphadenopathy. No pelvic sidewall  lymphadenopathy. Reproductive: Uterus surgically absent. 2 cm simple appearing cyst noted left ovary. No follow-up imaging recommended. Note: This recommendation does not apply to premenarchal patients and to those with increased risk (genetic, family history, elevated tumor markers or other high-risk factors) of ovarian cancer. Reference: JACR 2020 Feb; 17(2):248-254 Other: No intraperitoneal free fluid. Musculoskeletal: No worrisome lytic or sclerotic osseous abnormality. IMPRESSION: 1. No acute findings in the abdomen or pelvis. Specifically, no findings to explain the patient's history of abdominal pain. 2. Small hiatal hernia. 3. Aortic Atherosclerosis (ICD10-I70.0). Electronically Signed   By: Kennith CenterEric  Mansell M.D.   On: 04/16/2021 11:18    Procedures Procedures   Medications Ordered in ED Medications  sodium chloride 0.9 % bolus 1,000 mL (0 mLs Intravenous Stopped 04/16/21 1055)  ondansetron (ZOFRAN) injection 4 mg (4 mg Intravenous Given 04/16/21 1018)  pantoprazole (PROTONIX) injection 40 mg (40 mg Intravenous Given 04/16/21 1018)  iohexol (OMNIPAQUE) 300 MG/ML solution 100 mL (100 mLs Intravenous Contrast Given 04/16/21 1035)  potassium chloride SA (KLOR-CON) CR tablet 40 mEq (40 mEq Oral Given 04/16/21 1145)    ED Course  I have reviewed the triage vital signs and the nursing notes.  Pertinent labs & imaging results that were available during my care of the patient were reviewed by me and considered in my medical decision making (see chart for details).  Clinical Course as of 04/16/21 1152  Mon Apr 16, 2021  [id number] 60 year old female with complaint of left upper abdominal pain for few weeks now with nausea, vomiting, diarrhea.  On exam, abdomen soft and nontender.  CBC without significant findings, lipase within normal meds, CMP with mild hypokalemia with potassium of 3.3 otherwise normal renal function normal LFTs.  Urinalysis unremarkable.  Patient was given Zofran and Protonix.  Symptoms  have improved.  Plan is to replace potassium orally prior to discharge. CT without acute findings to explain her pain.  Does have a history of peptic ulcer disease.  Plan is to increase Protonix for a brief course with plan to see PCP tomorrow, may potentially need referral to GI if not improving. [LM]    Clinical Course User Index [LM] Alden HippMurphy, Nhyla Nappi A, PA-C   MDM Rules/Calculators/A&P  Final Clinical Impression(s) / ED Diagnoses Final diagnoses:  Left upper quadrant abdominal pain  Nausea vomiting and diarrhea    Rx / DC Orders ED Discharge Orders          Ordered    pantoprazole (PROTONIX) 20 MG tablet  Daily        04/16/21 1144    ondansetron (ZOFRAN ODT) 4 MG disintegrating tablet  Every 8 hours PRN        04/16/21 1144             Jeannie Fend, PA-C 04/16/21 1152    Bethann Berkshire, MD 04/19/21 1012

## 2021-04-16 NOTE — Discharge Instructions (Addendum)
Follow up with your doctor tomorrow for recheck and possible referral to GI.

## 2021-04-16 NOTE — ED Triage Notes (Signed)
Pt states she has been having abdominal pain for a couple weeks. Pt states she has contacted doctor at [facility] but felt she could not hold on until her appointment. Pt states her pain is left side of her abdomen "generalized" and "moving" between lower and upper. Pt states Saturday she had vomited all night. Pt states she has also had diarrhea since Saturday but has not vomited since Saturday night.

## 2023-07-12 IMAGING — CT CT ABD-PELV W/ CM
2 of 5 series · 16 of 46 positions shown, 18 images · IV contrast (Omnipaque or Isovue)
Comparison: None.

CLINICAL DATA: Abdominal pain

EXAM:
CT ABDOMEN AND PELVIS WITH CONTRAST
TECHNIQUE: Multidetector CT imaging of the abdomen and pelvis was performed
using the standard protocol following bolus administration of
intravenous contrast.
CONTRAST:  100mL OMNIPAQUE IOHEXOL 300 MG/ML  SOLN

[Series 2: axial st · axial · 0.98mm/px · z∈[+841,+1256]mm · 13 of 95 slices shown, 15 images]
[im 6/95  soft-tissue]
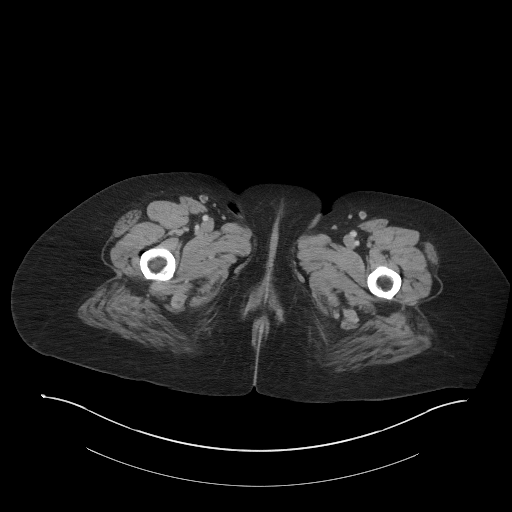
[im 6/95  bone]
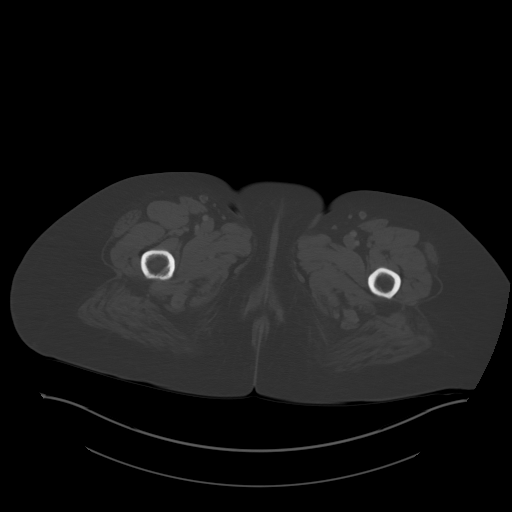
[im 11/95  soft-tissue]
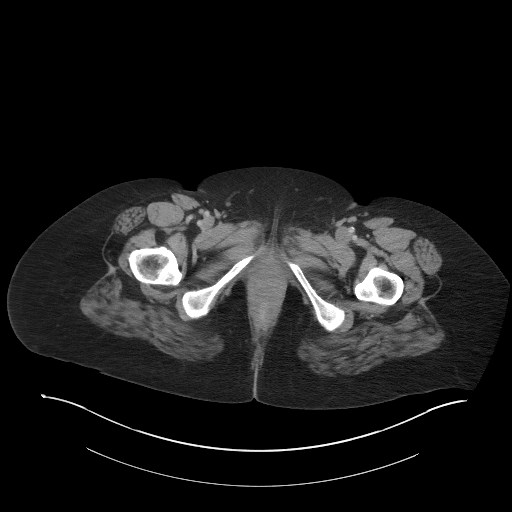
[im 21/95  soft-tissue]
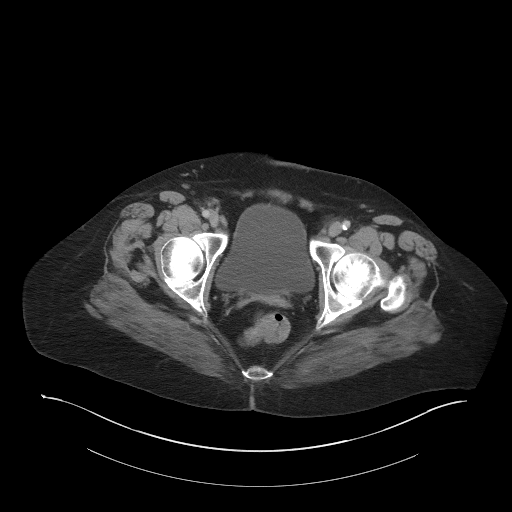
[im 27/95  soft-tissue]
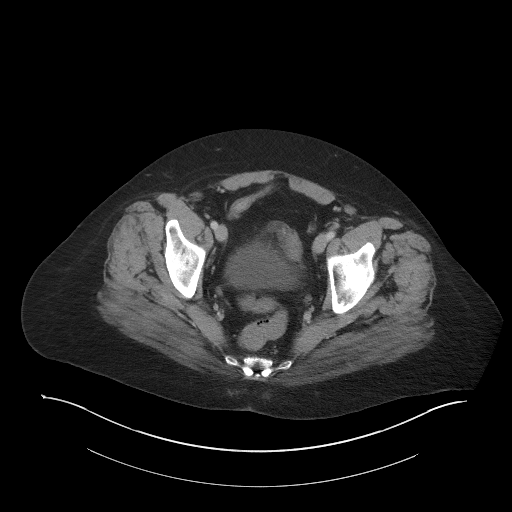
[im 32/95  soft-tissue]
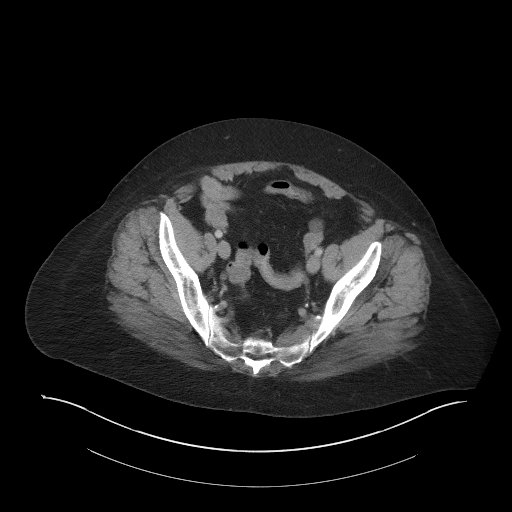
[im 42/95  soft-tissue]
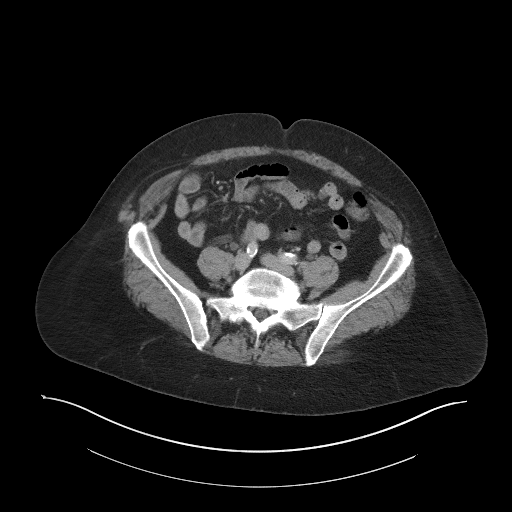
[im 48/95  soft-tissue]
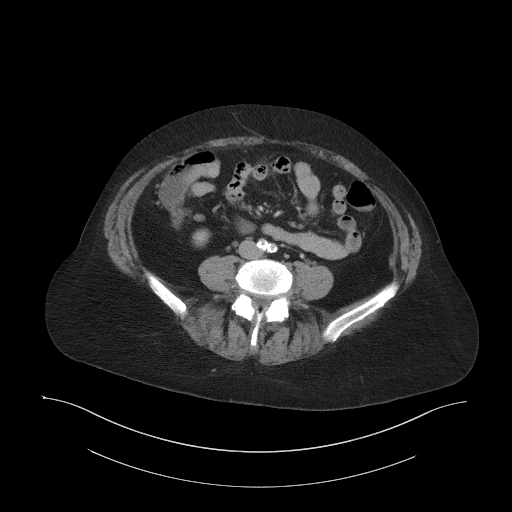
[im 53/95  soft-tissue]
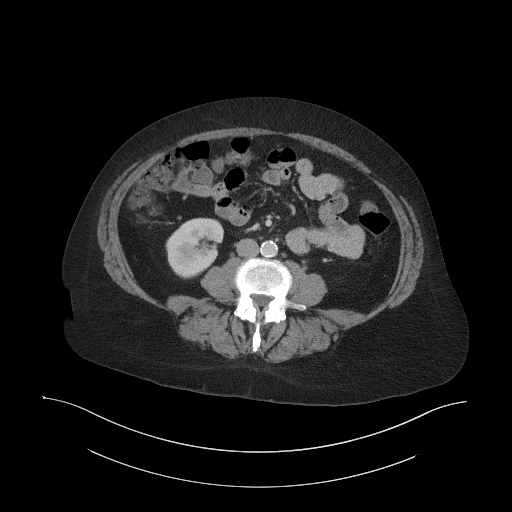
[im 63/95  soft-tissue]
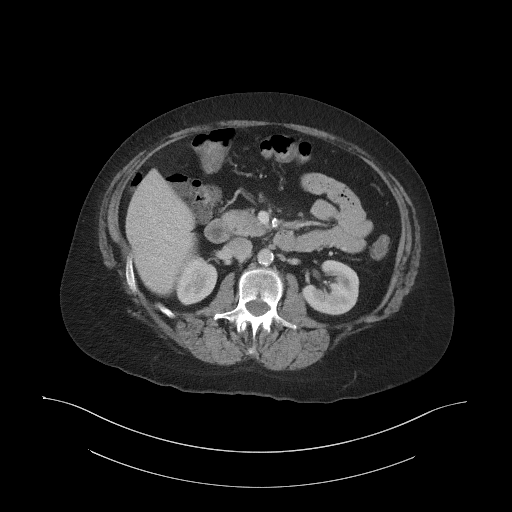
[im 63/95  bone]
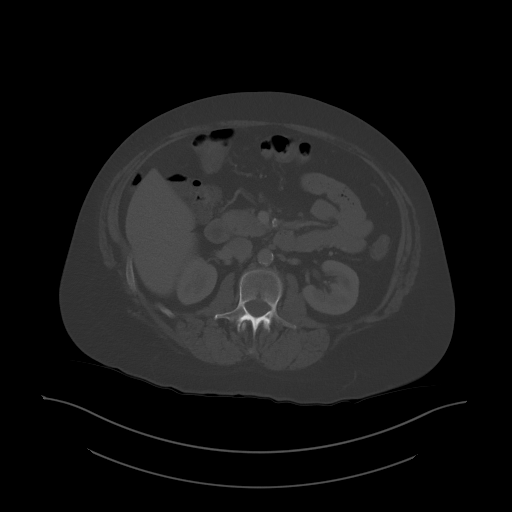
[im 68/95  soft-tissue]
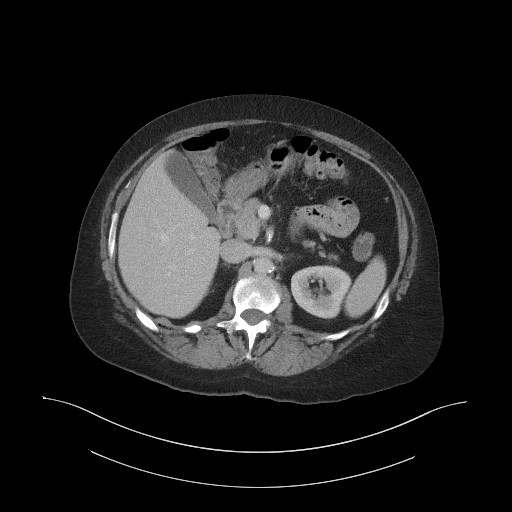
[im 74/95  soft-tissue]
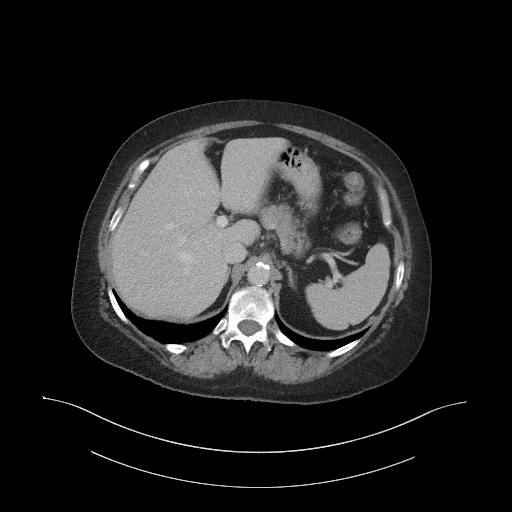
[im 84/95  soft-tissue]
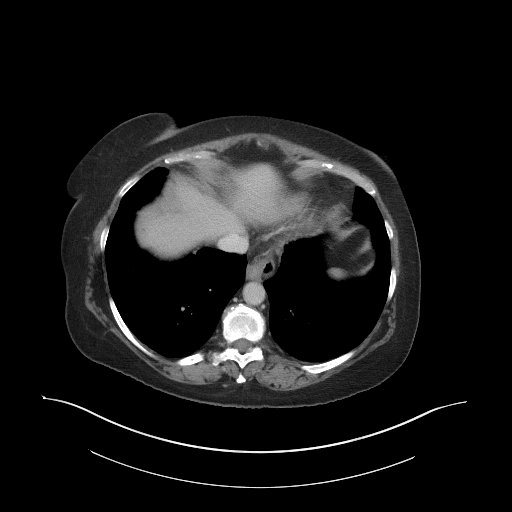
[im 89/95  soft-tissue]
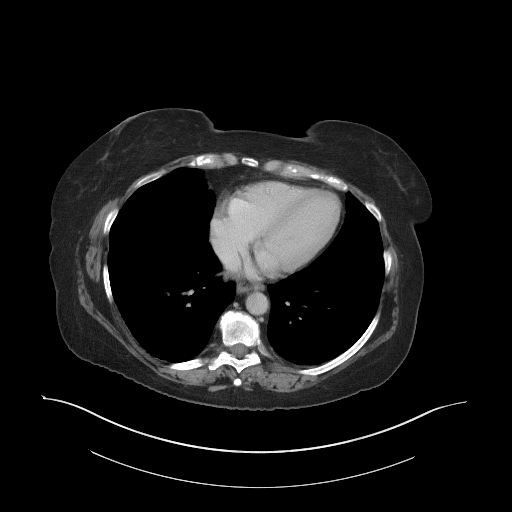

[Series 5: coronal st · coronal · 0.82mm/px · 3 of 117 slices shown]
[im 39/117  soft-tissue]
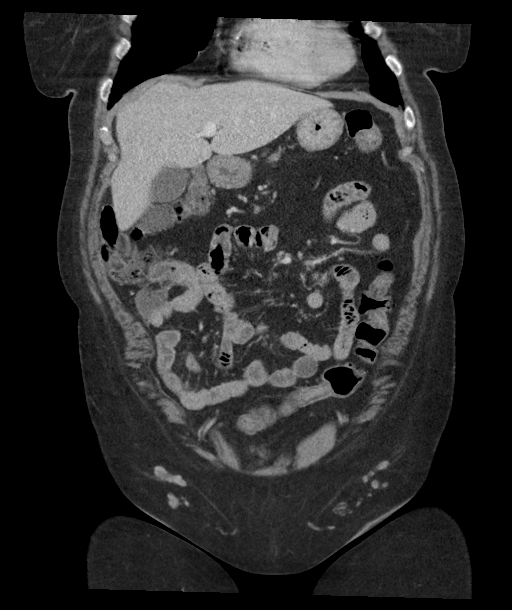
[im 52/117  soft-tissue]
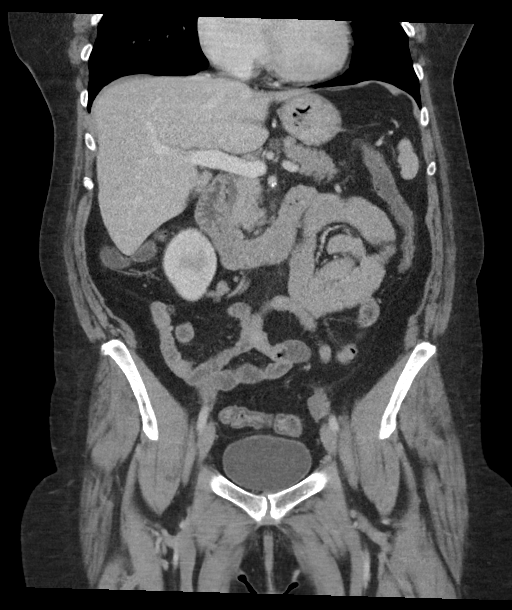
[im 65/117  soft-tissue]
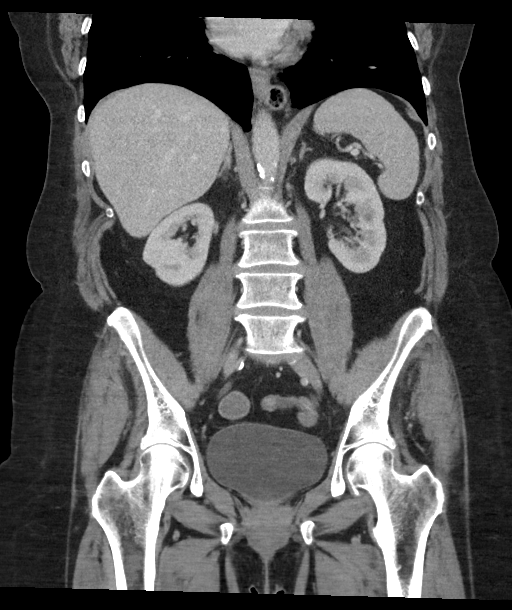

[16 of 46 positions shown; findings below may reference images not displayed]

FINDINGS: Lower chest: Small hiatal hernia.

Hepatobiliary: No suspicious focal abnormality within the liver
parenchyma. There is no evidence for gallstones, gallbladder wall
thickening, or pericholecystic fluid. No intrahepatic or
extrahepatic biliary dilation.

Pancreas: No focal mass lesion. No dilatation of the main duct. No
intraparenchymal cyst. No peripancreatic edema.

Spleen: No splenomegaly. No focal mass lesion.

Adrenals/Urinary Tract: No adrenal nodule or mass. Kidneys
unremarkable. No evidence for hydroureter. The urinary bladder
appears normal for the degree of distention.

Stomach/Bowel: Small hiatal hernia. Stomach otherwise unremarkable.
Duodenum is normally positioned as is the ligament of Treitz. No
small bowel wall thickening. No small bowel dilatation. The terminal
ileum is normal. The appendix is normal. No gross colonic mass. No
colonic wall thickening.

Vascular/Lymphatic: There is moderate atherosclerotic calcification
of the abdominal aorta without aneurysm. There is no gastrohepatic
or hepatoduodenal ligament lymphadenopathy. No retroperitoneal or
mesenteric lymphadenopathy. No pelvic sidewall lymphadenopathy.

Reproductive: Uterus surgically absent. 2 cm simple appearing cyst
noted left ovary. No follow-up imaging recommended. Note: This
recommendation does not apply to premenarchal patients and to those
with increased risk (genetic, family history, elevated tumor markers
or other high-risk factors) of ovarian cancer. Reference: JACR [DATE]):248-254

Other: No intraperitoneal free fluid.

Musculoskeletal: No worrisome lytic or sclerotic osseous
abnormality.
IMPRESSION: 1. No acute findings in the abdomen or pelvis. Specifically, no
findings to explain the patient's history of abdominal pain.
2. Small hiatal hernia.
3. Aortic Atherosclerosis ([id number]).
